# Patient Record
Sex: Male | Born: 1995 | Race: White | Hispanic: No | Marital: Single | Smoking: Current some day smoker
Health system: Southern US, Community
[De-identification: ages and names within clinical notes are randomized; demographics above are authoritative.]

## PROBLEM LIST (undated history)

## (undated) DIAGNOSIS — R569 Unspecified convulsions: Secondary | ICD-10-CM

## (undated) HISTORY — PX: APPENDECTOMY: SHX54

---
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
## 2016-09-29 ENCOUNTER — Emergency Department
Admission: EM | Admit: 2016-09-29 | Discharge: 2016-09-29 | Disposition: A | Discharge status: Home / Self Care | Payer: Medicaid Other | Attending: Emergency Medicine | Admitting: Emergency Medicine

## 2016-09-29 ENCOUNTER — Emergency Department: Payer: Medicaid Other

## 2016-09-29 DIAGNOSIS — Y990 Civilian activity done for income or pay: Secondary | ICD-10-CM | POA: Diagnosis not present

## 2016-09-29 DIAGNOSIS — M79601 Pain in right arm: Secondary | ICD-10-CM

## 2016-09-29 DIAGNOSIS — Y929 Unspecified place or not applicable: Secondary | ICD-10-CM | POA: Diagnosis not present

## 2016-09-29 DIAGNOSIS — M79621 Pain in right upper arm: Secondary | ICD-10-CM | POA: Insufficient documentation

## 2016-09-29 DIAGNOSIS — Y9389 Activity, other specified: Secondary | ICD-10-CM | POA: Insufficient documentation

## 2016-09-29 DIAGNOSIS — Z791 Long term (current) use of non-steroidal anti-inflammatories (NSAID): Secondary | ICD-10-CM | POA: Diagnosis not present

## 2016-09-29 DIAGNOSIS — W228XXA Striking against or struck by other objects, initial encounter: Secondary | ICD-10-CM | POA: Insufficient documentation

## 2016-09-29 DIAGNOSIS — S4991XA Unspecified injury of right shoulder and upper arm, initial encounter: Secondary | ICD-10-CM | POA: Diagnosis present

## 2016-09-29 DIAGNOSIS — F17210 Nicotine dependence, cigarettes, uncomplicated: Secondary | ICD-10-CM | POA: Insufficient documentation

## 2016-09-29 MED ORDER — MELOXICAM 7.5 MG PO TABS: 7.5000 mg | ORAL_TABLET | Freq: Every day | ORAL | 0 refills | Status: AC

## 2016-09-29 NOTE — ED Provider Notes (Signed)
[facility] Emergency Department Provider Note ____________________________________________  Time seen: Approximately 2:41 PM  I have reviewed the triage vital signs and the nursing notes.   HISTORY  Chief Complaint Arm Pain    HPI Joshua Cannon is a 20 y.o. male that presents with right forearm pain after hitting it on the axle of his car 3 days ago. Patient states the pain feels like a throbbing pain. Patient denies swelling. Patient has not taken anything for pain. Patient injured same arm 1.5 years after breaking a windshield with it and is concerned there may still be a piece left in his arm.   No past medical history on file.  There are no active problems to display for this patient.   Past Surgical History:  Procedure Laterality Date  . APPENDECTOMY      Prior to Admission medications   Medication Sig Start Date End Date Taking? Authorizing Provider  ibuprofen (ADVIL,MOTRIN) 800 MG tablet Take 1 tablet (800 mg total) by mouth every 8 (eight) hours as needed. 11/11/15   Charmayne Sheerharles M Beers, PA-C  meloxicam (MOBIC) 7.5 MG tablet Take 1 tablet (7.5 mg total) by mouth daily. 09/29/16 10/13/16  Joshua DerryAshley Alliana Mcauliff, PA-C  sulfamethoxazole-trimethoprim (BACTRIM DS,SEPTRA DS) 800-160 MG tablet Take 1 tablet by mouth 2 (two) times daily. 11/11/15   Joshua Dakinharles M Beers, PA-C    Allergies Patient has no known allergies.  No family history on file.  Social History Social History  Substance Use Topics  . Smoking status: Current Some Day Smoker    Types: Cigarettes  . Smokeless tobacco: Not on file  . Alcohol use No    Review of Systems Constitutional: No recent illness. No fever. Cardiovascular: Denies chest pain or palpitations. Respiratory: Denies shortness of breath. Abdominal:  Denies abdominal pain. No nausea or vomiting  Musculoskeletal: Pain in right forearm.  Skin: Negative for rash, wound, lesion. Neurological: Negative for focal weakness or  numbness.  ____________________________________________   PHYSICAL EXAM:  VITAL SIGNS: ED Triage Vitals  Enc Vitals Group     BP 09/29/16 1312 135/69     Pulse Rate 09/29/16 1312 85     Resp 09/29/16 1312 18     Temp 09/29/16 1312 97.6 F (36.4 C)     Temp Source 09/29/16 1312 Oral     SpO2 09/29/16 1312 99 %     Weight 09/29/16 1313 155 lb (70.3 kg)     Height 09/29/16 1313 5\' 3"  (1.6 m)     Head Circumference --      Peak Flow --      Pain Score 09/29/16 1311 10     Pain Loc --      Pain Edu? --      Excl. in GC? --     Constitutional: Alert and oriented. Well appearing and in no acute distress. Eyes: Conjunctivae are normal. EOMI. Head: Atraumatic. Neck: No stridor.  Respiratory: Normal respiratory effort.   Musculoskeletal: Full ROM of upper extremities. No tenderness to palpation.   Neurologic:  Normal speech and language. No gross focal neurologic deficits are appreciated. Speech is normal. No gait instability. Skin:1/2 in scar and mobile lump over mid forearm.  Skin is warm, dry and intact. No rashes or bruises. Atraumatic. Psychiatric: Mood and affect are normal. Speech and behavior are normal. ______________________________________   LABS (all labs ordered are listed, but only abnormal results are displayed)  Labs Reviewed - No data to display ____________________________________________  RADIOLOGY  Joshua Cannon, Joshua Cannon,  personally viewed and evaluated these images (plain radiographs) as part of my medical decision making, as well as reviewing the written report by the radiologist.  There are 2 radioopaque foreign bodies in the soft tissue of forearm per radiology.    INITIAL IMPRESSION / ASSESSMENT AND PLAN / ED COURSE  Clinical Course     Pertinent labs & imaging results that were available during my care of the patient were reviewed by me and considered in my medical decision making (see chart for details).  Patient was instructed to RICE right arm  and take Meloxicam for pain. Patient should follow up with PCP and was instructed that 2 foreign bodies were seen on xray.   FINAL CLINICAL IMPRESSION(S) / ED DIAGNOSES  Final diagnoses:  Right arm pain      Joshua Cannon [place], PA-C 09/29/16 1553    Arnaldo NatalPaul F Malinda, MD 10/01/16 0005

## 2016-09-29 NOTE — ED Triage Notes (Signed)
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

## 2016-09-29 NOTE — ED Notes (Signed)
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

## 2016-12-29 DIAGNOSIS — L905 Scar conditions and fibrosis of skin: Secondary | ICD-10-CM | POA: Diagnosis not present

## 2016-12-29 DIAGNOSIS — R208 Other disturbances of skin sensation: Secondary | ICD-10-CM | POA: Diagnosis not present

## 2016-12-29 DIAGNOSIS — D485 Neoplasm of uncertain behavior of skin: Secondary | ICD-10-CM | POA: Diagnosis not present

## 2016-12-29 DIAGNOSIS — R234 Changes in skin texture: Secondary | ICD-10-CM | POA: Diagnosis not present

## 2018-09-16 ENCOUNTER — Other Ambulatory Visit: Payer: Self-pay

## 2018-09-16 ENCOUNTER — Emergency Department: Payer: Medicaid Other

## 2018-09-16 ENCOUNTER — Emergency Department
Admission: EM | Admit: 2018-09-16 | Discharge: 2018-09-16 | Disposition: A | Discharge status: Home / Self Care | Payer: Medicaid Other | Attending: Emergency Medicine | Admitting: Emergency Medicine

## 2018-09-16 DIAGNOSIS — R569 Unspecified convulsions: Secondary | ICD-10-CM | POA: Diagnosis not present

## 2018-09-16 DIAGNOSIS — F439 Reaction to severe stress, unspecified: Secondary | ICD-10-CM | POA: Diagnosis not present

## 2018-09-16 DIAGNOSIS — F430 Acute stress reaction: Secondary | ICD-10-CM | POA: Diagnosis not present

## 2018-09-16 DIAGNOSIS — F17210 Nicotine dependence, cigarettes, uncomplicated: Secondary | ICD-10-CM | POA: Diagnosis not present

## 2018-09-16 DIAGNOSIS — S0990XA Unspecified injury of head, initial encounter: Secondary | ICD-10-CM | POA: Diagnosis not present

## 2018-09-16 LAB — URINE DRUG SCREEN, QUALITATIVE (ARMC ONLY)
Amphetamines, Ur Screen: NOT DETECTED
BARBITURATES, UR SCREEN: NOT DETECTED
Benzodiazepine, Ur Scrn: POSITIVE — AB
CANNABINOID 50 NG, UR ~~LOC~~: POSITIVE — AB
COCAINE METABOLITE, UR ~~LOC~~: NOT DETECTED
MDMA (ECSTASY) UR SCREEN: NOT DETECTED
Methadone Scn, Ur: NOT DETECTED
Opiate, Ur Screen: NOT DETECTED
PHENCYCLIDINE (PCP) UR S: NOT DETECTED
Tricyclic, Ur Screen: NOT DETECTED

## 2018-09-16 LAB — CBC WITH DIFFERENTIAL/PLATELET
Abs Immature Granulocytes: 0.2 10*3/uL — ABNORMAL HIGH (ref 0.00–0.07)
Basophils Absolute: 0.1 10*3/uL (ref 0.0–0.1)
Basophils Relative: 1 %
EOS ABS: 0.1 10*3/uL (ref 0.0–0.5)
EOS PCT: 1 %
HCT: 47.9 % (ref 39.0–52.0)
Hemoglobin: 16.4 g/dL (ref 13.0–17.0)
Immature Granulocytes: 2 %
Lymphocytes Relative: 18 %
Lymphs Abs: 2.3 10*3/uL (ref 0.7–4.0)
MCH: 30.9 pg (ref 26.0–34.0)
MCHC: 34.2 g/dL (ref 30.0–36.0)
MCV: 90.4 fL (ref 80.0–100.0)
MONO ABS: 0.7 10*3/uL (ref 0.1–1.0)
MONOS PCT: 6 %
NEUTROS ABS: 9.1 10*3/uL — AB (ref 1.7–7.7)
Neutrophils Relative %: 72 %
Platelets: 214 10*3/uL (ref 150–400)
RBC: 5.3 MIL/uL (ref 4.22–5.81)
RDW: 12.5 % (ref 11.5–15.5)
WBC: 12.3 10*3/uL — AB (ref 4.0–10.5)
nRBC: 0 % (ref 0.0–0.2)

## 2018-09-16 LAB — BASIC METABOLIC PANEL
Anion gap: 10 (ref 5–15)
BUN: 11 mg/dL (ref 6–20)
CO2: 26 mmol/L (ref 22–32)
CREATININE: 0.95 mg/dL (ref 0.61–1.24)
Calcium: 9.1 mg/dL (ref 8.9–10.3)
Chloride: 103 mmol/L (ref 98–111)
GFR calc Af Amer: >60 mL/min (ref >60)
GLUCOSE: 118 mg/dL — AB (ref 70–99)
Potassium: 3.8 mmol/L (ref 3.5–5.1)
Sodium: 139 mmol/L (ref 135–145)

## 2018-09-16 LAB — ETHANOL

## 2018-09-16 NOTE — ED Provider Notes (Signed)
[facility] Emergency Department Provider Note  ____________________________________________   I have reviewed the triage vital signs and the nursing notes.   HISTORY  Chief Complaint Seizures   History limited by: Not Limited   HPI Joshua Cannon is a 22 y.o. male who presents to the emergency department today because of concern for seizure like activity. The patient states that he has been under a lot of stress recently. The patient's mother states she gets stressed induced seizures. Today he was out with his family when he had full body convulsions per his mother. The patient did not feel bad today and had no sense that he was about to have any episode. The patient is complaining of some headache after the episode. The mother states it lasted roughly 5 minutes. The patient was not incontinent. Denies any recent sleep depravation or alcohol use. Does admit to [facility] use.    Per medical record review patient has a history of appendectomy  History reviewed. No pertinent past medical history.  There are no active problems to display for this patient.   Past Surgical History:  Procedure Laterality Date  . APPENDECTOMY      Prior to Admission medications   Medication Sig Start Date End Date Taking? Authorizing Provider  ibuprofen (ADVIL,MOTRIN) 800 MG tablet Take 1 tablet (800 mg total) by mouth every 8 (eight) hours as needed. 11/11/15   Beers, Charmayne Sheer, PA-C  sulfamethoxazole-trimethoprim (BACTRIM DS,SEPTRA DS) 800-160 MG tablet Take 1 tablet by mouth 2 (two) times daily. 11/11/15   [name], PA-C    Allergies Patient has no known allergies.  History reviewed. No pertinent family history.  Social History Social History   Tobacco Use  . Smoking status: Current Some Day Smoker    Types: Cigarettes  . Smokeless tobacco: Never Used  Substance Use Topics  . Alcohol use: No  . Drug use: No    Review of Systems Constitutional: No  fever/chills Eyes: No visual changes. ENT: No sore throat. Cardiovascular: Denies chest pain. Respiratory: Denies shortness of breath. Gastrointestinal: No abdominal pain.  No nausea, no vomiting.  No diarrhea.  Genitourinary: Negative for dysuria. Musculoskeletal: Negative for back pain. Skin: Negative for rash. Neurological: Positive for headache, seizure like activity.  ____________________________________________   PHYSICAL EXAM:  VITAL SIGNS: ED Triage Vitals  Enc Vitals Group     BP 09/16/18 1608 (!) 154/86     Pulse Rate 09/16/18 1608 (!) 111     Resp --      Temp 09/16/18 1608 98.2 F (36.8 C)     Temp Source 09/16/18 1608 Oral     SpO2 09/16/18 1558 96 %     Weight 09/16/18 1608 153 lb (69.4 kg)     Height 09/16/18 1608 5\' 6"  (1.676 m)     Head Circumference --      Peak Flow --      Pain Score 09/16/18 1608 0    Constitutional: Alert and oriented.  Eyes: Conjunctivae are normal.  ENT      Head: Normocephalic. Small bruise to forehead.       Nose: No congestion/rhinnorhea.      Mouth/Throat: Mucous membranes are moist.      Neck: No stridor. Hematological/Lymphatic/Immunilogical: No cervical lymphadenopathy. Cardiovascular: Normal rate, regular rhythm.  No murmurs, rubs, or gallops.  Respiratory: Normal respiratory effort without tachypnea nor retractions. Breath sounds are clear and equal bilaterally. No wheezes/rales/rhonchi. Gastrointestinal: Soft and non tender. No rebound. No guarding.  Genitourinary: Deferred Musculoskeletal: Normal range of motion in all extremities. No lower extremity edema. Neurologic:  Normal speech and language. No gross focal neurologic deficits are appreciated.  Skin:  Skin is warm, dry and intact. No rash noted. Psychiatric: Mood and affect are normal. Speech and behavior are normal. Patient exhibits appropriate insight and judgment.  ____________________________________________    LABS (pertinent  positives/negatives)  Ethanol <10 BMP wnl except glu 118 CBC wbc 12.3, hgb 16.4, plt 214  ____________________________________________   EKG  I, Phineas Semen, attending physician, personally viewed and interpreted this EKG  EKG Time: 1635 Rate: 82 Rhythm: sinus rhythm Axis: normal Intervals: qtc 412 QRS: RSR' in V1 ST changes: no st elevation Impression: RSR' in V1 otherwise normal  ____________________________________________    RADIOLOGY  CT head No acute findings  ____________________________________________   PROCEDURES  Procedures  ____________________________________________   INITIAL IMPRESSION / ASSESSMENT AND PLAN / ED COURSE  Pertinent labs & imaging results that were available during my care of the patient were reviewed by me and considered in my medical decision making (see chart for details).   Patient presented to the emergency department today with seizure-like activity.  On exam here patient is awake alert and completely appropriate.  Patient has no personal history of seizures although mother states she gets seizures that are stress-induced.  Head CT blood work here without any obvious etiology of the patient's symptoms.  He does have some cannabis on his UDS.  The patient was given neurology follow-up information.  We did discuss importance of the patient not drive or engage in any activity that could be dangerous to him himself or others if he was to have another seizure.   ____________________________________________   FINAL CLINICAL IMPRESSION(S) / ED DIAGNOSES  Final diagnoses:  Seizure-like activity (HCC)  Stress     Note: This dictation was prepared with Dragon dictation. Any transcriptional errors that result from this process are unintentional     Phineas Semen, MD 09/16/18 [phone]

## 2018-09-16 NOTE — Discharge Instructions (Addendum)
As we discussed please do not drive, go up on ladders/roofs, go swimming or engage in any activity that could be dangerous to yourself or others if you were to have another seizure. Please follow up with neurology. Please seek medical attention for any high fevers, chest pain, shortness of breath, change in behavior, persistent vomiting, bloody stool or any other new or concerning symptoms.

## 2018-09-16 NOTE — ED Notes (Signed)
Seizure prec in place.  

## 2018-09-16 NOTE — ED Triage Notes (Signed)
Pt brought in via EMS when he collapsed while trick-or-treating. EMS states fam history of seizures but this is pt's first episode. Per EMS/family seizure lasted about 5 mins. Per EMS it took 10 mins for pt to get back to baseline (A&Ox4).

## 2018-09-16 NOTE — ED Notes (Signed)
Pt has small hematoma to L side of forehead.

## 2018-09-20 DIAGNOSIS — Z113 Encounter for screening for infections with a predominantly sexual mode of transmission: Secondary | ICD-10-CM | POA: Diagnosis not present

## 2018-10-21 ENCOUNTER — Other Ambulatory Visit: Payer: Self-pay

## 2018-10-21 ENCOUNTER — Emergency Department
Admission: EM | Admit: 2018-10-21 | Discharge: 2018-10-21 | Disposition: A | Discharge status: Home / Self Care | Payer: Medicaid Other | Attending: Emergency Medicine | Admitting: Emergency Medicine

## 2018-10-21 ENCOUNTER — Encounter: Payer: Self-pay | Admitting: Emergency Medicine

## 2018-10-21 DIAGNOSIS — R000 Tachycardia, unspecified: Secondary | ICD-10-CM | POA: Diagnosis not present

## 2018-10-21 DIAGNOSIS — F17210 Nicotine dependence, cigarettes, uncomplicated: Secondary | ICD-10-CM | POA: Diagnosis not present

## 2018-10-21 DIAGNOSIS — R404 Transient alteration of awareness: Secondary | ICD-10-CM | POA: Diagnosis not present

## 2018-10-21 DIAGNOSIS — R569 Unspecified convulsions: Secondary | ICD-10-CM | POA: Diagnosis not present

## 2018-10-21 LAB — CBC WITH DIFFERENTIAL/PLATELET
Abs Immature Granulocytes: 0.12 10*3/uL — ABNORMAL HIGH (ref 0.00–0.07)
BASOS PCT: 1 %
Basophils Absolute: 0.1 10*3/uL (ref 0.0–0.1)
EOS ABS: 0.2 10*3/uL (ref 0.0–0.5)
Eosinophils Relative: 1 %
HEMATOCRIT: 53 % — AB (ref 39.0–52.0)
Hemoglobin: 17 g/dL (ref 13.0–17.0)
Immature Granulocytes: 1 %
LYMPHS ABS: 2.7 10*3/uL (ref 0.7–4.0)
Lymphocytes Relative: 17 %
MCH: 30.7 pg (ref 26.0–34.0)
MCHC: 32.1 g/dL (ref 30.0–36.0)
MCV: 95.8 fL (ref 80.0–100.0)
MONOS PCT: 6 %
Monocytes Absolute: 0.9 10*3/uL (ref 0.1–1.0)
Neutro Abs: 11.8 10*3/uL — ABNORMAL HIGH (ref 1.7–7.7)
Neutrophils Relative %: 74 %
PLATELETS: 296 10*3/uL (ref 150–400)
RBC: 5.53 MIL/uL (ref 4.22–5.81)
RDW: 13.3 % (ref 11.5–15.5)
WBC: 15.8 10*3/uL — ABNORMAL HIGH (ref 4.0–10.5)
nRBC: 0 % (ref 0.0–0.2)

## 2018-10-21 LAB — BASIC METABOLIC PANEL
ANION GAP: 25 — AB (ref 5–15)
BUN: 8 mg/dL (ref 6–20)
CALCIUM: 9.3 mg/dL (ref 8.9–10.3)
CO2: 13 mmol/L — AB (ref 22–32)
CREATININE: 1.02 mg/dL (ref 0.61–1.24)
Chloride: 103 mmol/L (ref 98–111)
GLUCOSE: 187 mg/dL — AB (ref 70–99)
Potassium: 4.2 mmol/L (ref 3.5–5.1)
Sodium: 141 mmol/L (ref 135–145)

## 2018-10-21 LAB — URINE DRUG SCREEN, QUALITATIVE (ARMC ONLY)
Amphetamines, Ur Screen: NOT DETECTED
BENZODIAZEPINE, UR SCRN: NOT DETECTED
Barbiturates, Ur Screen: NOT DETECTED
CANNABINOID 50 NG, UR ~~LOC~~: POSITIVE — AB
COCAINE METABOLITE, UR ~~LOC~~: NOT DETECTED
MDMA (Ecstasy)Ur Screen: NOT DETECTED
METHADONE SCREEN, URINE: NOT DETECTED
Opiate, Ur Screen: NOT DETECTED
Phencyclidine (PCP) Ur S: NOT DETECTED
TRICYCLIC, UR SCREEN: NOT DETECTED

## 2018-10-21 LAB — GLUCOSE, CAPILLARY: GLUCOSE-CAPILLARY: 174 mg/dL — AB (ref 70–99)

## 2018-10-21 LAB — ETHANOL: Alcohol, Ethyl (B): <10 mg/dL (ref <10)

## 2018-10-21 MED ORDER — LEVETIRACETAM 500 MG PO TABS: 500.0000 mg | ORAL_TABLET | Freq: Two times a day (BID) | ORAL | 1 refills | Status: AC

## 2018-10-21 MED ORDER — LEVETIRACETAM IN NACL 1000 MG/100ML IV SOLN
1000.0000 mg | Freq: Once | INTRAVENOUS | Status: AC
  Administered 2018-10-21: 1000 mg via INTRAVENOUS
  Filled 2018-10-21: qty 100

## 2018-10-21 NOTE — ED Provider Notes (Signed)
[facility] Emergency Department Provider Note  ____________________________________________   I have reviewed the triage vital signs and the nursing notes.   HISTORY  Chief Complaint Seizures   History limited by: Not Limited   HPI Joshua Cannon is a 22 y.o. male who presents to the emergency department today after an episode of seizure-like activity.  The patient was at work when it happened.  He just recalls waking up on the floor.  States he had had a normal morning.  A drink his normal stuff.  This point on a complaint of a little bit of low back pain where he thinks he hit the ground.  He was seen in the emergency department a little over 1 month ago for similar episode with negative work-up.  Patient states he has not yet been able to follow-up with neurology.  He denies any recent fevers or illness.    Per medical record review patient has a history of ER visit for seizure like activity with negative work up including CT head.   History reviewed. No pertinent past medical history.  There are no active problems to display for this patient.   Past Surgical History:  Procedure Laterality Date  . APPENDECTOMY      Prior to Admission medications   Medication Sig Start Date End Date Taking? Authorizing Provider  ibuprofen (ADVIL,MOTRIN) 800 MG tablet Take 1 tablet (800 mg total) by mouth every 8 (eight) hours as needed. 11/11/15   Beers, Charmayne Sheerharles M, PA-C  sulfamethoxazole-trimethoprim (BACTRIM DS,SEPTRA DS) 800-160 MG tablet Take 1 tablet by mouth 2 (two) times daily. 11/11/15   Evangeline DakinBeers, Charles M, PA-C    Allergies Patient has no known allergies.  No family history on file.  Social History Social History   Tobacco Use  . Smoking status: Current Some Day Smoker    Types: Cigarettes  . Smokeless tobacco: Never Used  Substance Use Topics  . Alcohol use: No  . Drug use: No    Review of Systems Constitutional: No fever/chills Eyes: No  visual changes. ENT: No sore throat. Cardiovascular: Denies chest pain. Respiratory: Denies shortness of breath. Gastrointestinal: No abdominal pain.  No nausea, no vomiting.  No diarrhea.   Genitourinary: Negative for dysuria. Musculoskeletal: Positive for low back pain. Skin: Negative for rash. Neurological: Positive for seizure like activity.  ____________________________________________   PHYSICAL EXAM:  VITAL SIGNS: ED Triage Vitals  Enc Vitals Group     BP 10/21/18 0933 128/74     Pulse Rate 10/21/18 0933 (!) 111     Resp 10/21/18 0933 16     Temp 10/21/18 0933 98 F (36.7 C)     Temp Source 10/21/18 0933 Oral     SpO2 10/21/18 0933 96 %     Weight 10/21/18 0931 170 lb (77.1 kg)     Height 10/21/18 0931 5\' 7"  (1.702 m)     Head Circumference --      Peak Flow --      Pain Score 10/21/18 0931 0   Constitutional: Alert and oriented.  Eyes: Conjunctivae are normal.  ENT      Head: Normocephalic and atraumatic.      Nose: No congestion/rhinnorhea.      Mouth/Throat: Mucous membranes are moist.      Neck: No stridor. Hematological/Lymphatic/Immunilogical: No cervical lymphadenopathy. Cardiovascular: Normal rate, regular rhythm.  No murmurs, rubs, or gallops.  Respiratory: Normal respiratory effort without tachypnea nor retractions. Breath sounds are clear and equal bilaterally. No wheezes/rales/rhonchi.  Gastrointestinal: Soft and non tender. No rebound. No guarding.  Genitourinary: Deferred Musculoskeletal: Normal range of motion in all extremities. No lower extremity edema. Neurologic:  Normal speech and language. No gross focal neurologic deficits are appreciated.  Skin:  Skin is warm, dry and intact. No rash noted. Psychiatric: Mood and affect are normal. Speech and behavior are normal. Patient exhibits appropriate insight and judgment.  ____________________________________________    LABS (pertinent positives/negatives)  UDS marijuana positive BMP wnl except  glu 187, co2 13 CBC wbc 15.8, hgb 17.0, plt 296 Etoh <10  ____________________________________________   EKG  None  ____________________________________________    RADIOLOGY  None  ____________________________________________   PROCEDURES  Procedures  ____________________________________________   INITIAL IMPRESSION / ASSESSMENT AND PLAN / ED COURSE  Pertinent labs & imaging results that were available during my care of the patient were reviewed by me and considered in my medical decision making (see chart for details).   Patient presented to the emergency department today because of concerns for seizure-like activity.  Patient was seen a little over 1 month ago for another seizure like episode.  Given that this patient is now a second episode I did discuss with neurology.  They did recommend starting Keppra.  Will IV load here start on 500 mg twice daily.  Again discussed with the patient importance of neurology follow-up.   ____________________________________________   FINAL CLINICAL IMPRESSION(S) / ED DIAGNOSES  Final diagnoses:  Seizure-like activity (HCC)     Note: This dictation was prepared with Dragon dictation. Any transcriptional errors that result from this process are unintentional     Phineas Semen, MD 10/21/18 1309

## 2018-10-21 NOTE — Discharge Instructions (Addendum)
Please do not drive, operate heavy machinery or put yourself or others in a dangerous position if you were to have another seizure. Please seek medical attention for any high fevers, chest pain, shortness of breath, change in behavior, persistent vomiting, bloody stool or any other new or concerning symptoms.

## 2018-10-21 NOTE — ED Triage Notes (Addendum)
Pt to ED via EMS from work where he had witnessed seizure, per EMS witness states lasted a few min and postictal x5-7410min. VSS, A&OX4, pt appears fatigue. Pt states he has had 1 seizure in the past a few months ago

## 2019-05-12 ENCOUNTER — Encounter: Payer: Self-pay | Admitting: Emergency Medicine

## 2019-05-12 ENCOUNTER — Emergency Department
Admission: EM | Admit: 2019-05-12 | Discharge: 2019-05-13 | Disposition: A | Discharge status: Home / Self Care | Payer: Medicaid Other | Attending: Emergency Medicine | Admitting: Emergency Medicine

## 2019-05-12 ENCOUNTER — Other Ambulatory Visit: Payer: Self-pay

## 2019-05-12 ENCOUNTER — Emergency Department: Payer: Medicaid Other

## 2019-05-12 DIAGNOSIS — R404 Transient alteration of awareness: Secondary | ICD-10-CM | POA: Diagnosis not present

## 2019-05-12 DIAGNOSIS — F17210 Nicotine dependence, cigarettes, uncomplicated: Secondary | ICD-10-CM | POA: Insufficient documentation

## 2019-05-12 DIAGNOSIS — Z79899 Other long term (current) drug therapy: Secondary | ICD-10-CM | POA: Diagnosis not present

## 2019-05-12 DIAGNOSIS — T50904A Poisoning by unspecified drugs, medicaments and biological substances, undetermined, initial encounter: Secondary | ICD-10-CM | POA: Diagnosis not present

## 2019-05-12 DIAGNOSIS — T5894XA Toxic effect of carbon monoxide from unspecified source, undetermined, initial encounter: Secondary | ICD-10-CM | POA: Diagnosis not present

## 2019-05-12 HISTORY — DX: Unspecified convulsions: R56.9

## 2019-05-12 LAB — BLOOD GAS, ARTERIAL
Acid-Base Excess: 4 mmol/L — ABNORMAL HIGH (ref 0.0–2.0)
Bicarbonate: 29.2 mmol/L — ABNORMAL HIGH (ref 20.0–28.0)
FIO2: 0.21
O2 Saturation: 98 %
Patient temperature: 37
pCO2 arterial: 45 mmHg (ref 32.0–48.0)
pH, Arterial: 7.42 (ref 7.350–7.450)
pO2, Arterial: 103 mmHg (ref 83.0–108.0)

## 2019-05-12 LAB — CBC
HCT: 44.6 % (ref 39.0–52.0)
Hemoglobin: 15.1 g/dL (ref 13.0–17.0)
MCH: 31.1 pg (ref 26.0–34.0)
MCHC: 33.9 g/dL (ref 30.0–36.0)
MCV: 91.8 fL (ref 80.0–100.0)
Platelets: 266 10*3/uL (ref 150–400)
RBC: 4.86 MIL/uL (ref 4.22–5.81)
RDW: 12.8 % (ref 11.5–15.5)
WBC: 10.3 10*3/uL (ref 4.0–10.5)
nRBC: 0 % (ref 0.0–0.2)

## 2019-05-12 LAB — COOXEMETRY PANEL
Carboxyhemoglobin: 4.9 % — ABNORMAL HIGH (ref 0.5–1.5)
Methemoglobin: 0.5 % (ref 0.0–1.5)
O2 Saturation: 97.6 %
Total oxygen content: 94.3 mL/dL

## 2019-05-12 LAB — BASIC METABOLIC PANEL
Anion gap: 8 (ref 5–15)
BUN: 9 mg/dL (ref 6–20)
CO2: 27 mmol/L (ref 22–32)
Calcium: 9.4 mg/dL (ref 8.9–10.3)
Chloride: 103 mmol/L (ref 98–111)
Creatinine, Ser: 0.72 mg/dL (ref 0.61–1.24)
GFR calc Af Amer: >60 mL/min (ref >60)
GFR calc non Af Amer: >60 mL/min (ref >60)
Glucose, Bld: 95 mg/dL (ref 70–99)
Potassium: 3.9 mmol/L (ref 3.5–5.1)
Sodium: 138 mmol/L (ref 135–145)

## 2019-05-12 LAB — ETHANOL: Alcohol, Ethyl (B): <10 mg/dL (ref <10)

## 2019-05-12 MED ORDER — LEVETIRACETAM IN NACL 1000 MG/100ML IV SOLN
1000.0000 mg | Freq: Once | INTRAVENOUS | Status: AC
  Administered 2019-05-12: 1000 mg via INTRAVENOUS
  Filled 2019-05-12: qty 100

## 2019-05-12 MED ORDER — SODIUM CHLORIDE 0.9 % IV BOLUS
500.0000 mL | Freq: Once | INTRAVENOUS | Status: AC
  Administered 2019-05-12: 19:00:00 500 mL via INTRAVENOUS

## 2019-05-12 MED ORDER — DROPERIDOL 2.5 MG/ML IJ SOLN
5.0000 mg | Freq: Once | INTRAMUSCULAR | Status: AC
  Administered 2019-05-12: 5 mg via INTRAMUSCULAR

## 2019-05-12 MED ORDER — NALOXONE HCL 2 MG/2ML IJ SOSY
PREFILLED_SYRINGE | INTRAMUSCULAR | Status: AC
  Administered 2019-05-12: 2 mg via INTRAVENOUS
  Filled 2019-05-12: qty 2

## 2019-05-12 MED ORDER — NALOXONE HCL 2 MG/2ML IJ SOSY
2.0000 mg | PREFILLED_SYRINGE | Freq: Once | INTRAMUSCULAR | Status: AC
  Administered 2019-05-12: 2 mg via INTRAVENOUS

## 2019-05-12 NOTE — ED Notes (Signed)
Pt assisted to use bedside toilet. Pt missing toilet completely. Unsteady.

## 2019-05-12 NOTE — ED Notes (Signed)
Will start assessments/etc once pt back to ED room 12.

## 2019-05-12 NOTE — ED Notes (Addendum)
Pt roomed. This RN at bedside with [place]. Officer present.

## 2019-05-12 NOTE — ED Triage Notes (Signed)
Pt presents to ED in custody of [place] [employer] for medical clearance to go to jail. Pt was stopped by PD driving a vehicle, clearly intoxicated. Pt is slurring words and stumbling. PD states pt's girlfriend was also in vehicle using xanax. Pt does not state what he has been taking.

## 2019-05-12 NOTE — ED Notes (Signed)
Seizure pads in place. Pt leaving for CT. Officer remains with pt. Will place IV once pt back from CT. Bloodwork sent during triage.

## 2019-05-12 NOTE — ED Notes (Addendum)
PD was given pill by family member that was suspected to be what pt took. This RN was told by MD mcshane to take pill down to pharmacy to have pill identified. Pharmacist Corene Cornea identified pill as 2mg  xanax, Per PD pill may be needed for chemical testing. This RN was instructed by Raquel charge RN to put pill in pt specific meds in pyxis. Verified with Raquel RN. Pts nurse Metta Clines made aware.

## 2019-05-12 NOTE — ED Notes (Signed)
Joshua Cannon (mother to pt's kids) called wanting update on pt. Explained pt is "stable" and did not allow for any other exchange of information since pt is unable to tell this RN who he is okay with being updated on his condition. Joshua Cannon requested this RN let pt know she called. Will let pt know.

## 2019-05-12 NOTE — ED Notes (Signed)
ABG results to EDP McShane in person.

## 2019-05-12 NOTE — ED Notes (Signed)
Forensic blood draw completed using sterile technique and povidone-iodine to clean site prior to puncture to right AC. Pt tolerated well. 1 attempt.

## 2019-05-12 NOTE — ED Notes (Signed)
CO HB RESULTS NOTIFIED TO DR.JAMES ,MCSHAWAN

## 2019-05-12 NOTE — ED Notes (Signed)
3 security officers at bedside as pt keeps trying to leave.

## 2019-05-12 NOTE — ED Notes (Addendum)
HOB adjusted for pt.  

## 2019-05-12 NOTE — ED Notes (Signed)
No reaction to narcan noted by officer and this RN.

## 2019-05-12 NOTE — ED Provider Notes (Signed)
-----------------------------------------   11:52 PM on 05/12/2019 -----------------------------------------   Assumed care from Dr. Burlene Arnt.  In short, Joshua Cannon is a 23 y.o. male with a chief complaint of AMS and apparent benzo overdose (likely accidental).  Refer to the original H&P for additional details.  The plan initially was to watch the patient and keep him safe until he is sober given his reassuring medical work-up.  He has multiple outstanding warrants from [employer] and he will be arrested once he is sober and ready for discharge.    However, he came awake but is still clearly not completely sober nor does he have the capacity to make any decisions.  He could barely stand on his own but he was trying to walk out of the ED and representing an immediate danger to himself.  He needed to urinate but could only make it to the commode with assistance from nursing staff and was barely able to stand upright.  However, in spite of this, he was combative with staff and was also representing a potential danger to them.  I made the decision to place him under involuntary commitment given his lack of decision-making capacity.  He required a calming agent and given his young age and constellation of symptoms I felt that droperidol 5 mg intramuscular would be an appropriate calming agent.  He will get an EKG once he has calm down and is able to hold still.  I have asked staff to put him on 2 L oxygen by nasal cannula and on a pulse oximeter and cardiac monitor.  Once he is awake and sober and once again has capacity, presumably in the morning, he may be appropriate for IVC resection and discharged to law enforcement custody.  ED ECG REPORT I, Hinda Kehr, the attending physician, personally viewed and interpreted this ECG.  Date: 05/13/2019 EKG Time: 00: 01 Rate: 69 Rhythm: normal sinus rhythm QRS Axis: normal Intervals: normal, no QTc prolongation ST/T Wave abnormalities: There is some  slight ST segment elevation which is attributable to early repolarization Narrative Interpretation: no evidence of acute ischemia   ----------------------------------------- 12:11 AM on 05/13/2019 -----------------------------------------  Patient resting comfortably.  QTc normal .  No physical restraints were required.    (Note that documentation was delayed due to multiple ED patients requiring immediate care.)   In the early morning hours the patient became awake, alert, ambulatory without difficulty, and still verbally disagreeable, argumentative with staff and calling 1 of the nurses a "fucking bitch" when she tried to assist him.  He is medically stable and appropriate for discharge with law enforcement.  They have been alerted and will take him into custody due to his outstanding warrants.  I gave my usual customary apparent medication overdose discharge instructions.   Hinda Kehr, MD 05/13/19 [phone]

## 2019-05-12 NOTE — ED Notes (Signed)
Respiratory notified ABG ordered. RT states they will be by soon to collect.
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
## 2019-05-12 NOTE — ED Notes (Signed)
Pt alert but lethargic at times; pt's speech slurred sometimes incomprehensible.

## 2019-05-12 NOTE — ED Notes (Signed)
Pt's mother Driscilla Moats [phone] given brief update.

## 2019-05-13 NOTE — ED Notes (Signed)
Pt refuses to sign discharge paperwork

## 2019-05-13 NOTE — Discharge Instructions (Signed)
You were seen in the emergency department for decreased mental status which appears to be due to taking too many benzodiazepines.  Please avoid taking this type of medication in excess and follow-up with [facility] to discuss possible substance abuse issues.  Return to the emergency department if he develop any new or worsening symptoms as well as if you have any thoughts of harming yourself or anyone else.

## 2019-05-13 NOTE — ED Notes (Signed)
Pt placed on 4L O2 via [place] at this time.

## 2019-05-13 NOTE — ED Notes (Signed)
Pt awake and cussing at this RN. Pt helped to restroom with steady slow gait.

## 2019-05-14 NOTE — Progress Notes (Signed)
Pharmacy tech discovered a bag in pyxis return bin labeled with this patients name and MRN number. In the bag are pieces of Xanax tablets(possibly 2 mg tablets). This bag was logged in the patient own medication log and then the bag was placed into control substance destruction box.

## 2020-09-17 DIAGNOSIS — Z419 Encounter for procedure for purposes other than remedying health state, unspecified: Secondary | ICD-10-CM | POA: Diagnosis not present

## 2020-10-17 DIAGNOSIS — Z419 Encounter for procedure for purposes other than remedying health state, unspecified: Secondary | ICD-10-CM | POA: Diagnosis not present

## 2020-11-17 DIAGNOSIS — Z419 Encounter for procedure for purposes other than remedying health state, unspecified: Secondary | ICD-10-CM | POA: Diagnosis not present

## 2020-11-29 DIAGNOSIS — Z20822 Contact with and (suspected) exposure to covid-19: Secondary | ICD-10-CM | POA: Diagnosis not present

## 2020-12-18 DIAGNOSIS — Z419 Encounter for procedure for purposes other than remedying health state, unspecified: Secondary | ICD-10-CM | POA: Diagnosis not present

## 2021-01-15 DIAGNOSIS — Z419 Encounter for procedure for purposes other than remedying health state, unspecified: Secondary | ICD-10-CM | POA: Diagnosis not present

## 2021-02-15 DIAGNOSIS — Z419 Encounter for procedure for purposes other than remedying health state, unspecified: Secondary | ICD-10-CM | POA: Diagnosis not present

## 2021-03-17 DIAGNOSIS — Z419 Encounter for procedure for purposes other than remedying health state, unspecified: Secondary | ICD-10-CM | POA: Diagnosis not present

## 2021-04-17 DIAGNOSIS — Z419 Encounter for procedure for purposes other than remedying health state, unspecified: Secondary | ICD-10-CM | POA: Diagnosis not present

## 2021-05-17 DIAGNOSIS — Z419 Encounter for procedure for purposes other than remedying health state, unspecified: Secondary | ICD-10-CM | POA: Diagnosis not present

## 2021-06-17 DIAGNOSIS — Z419 Encounter for procedure for purposes other than remedying health state, unspecified: Secondary | ICD-10-CM | POA: Diagnosis not present

## 2021-07-18 DIAGNOSIS — Z419 Encounter for procedure for purposes other than remedying health state, unspecified: Secondary | ICD-10-CM | POA: Diagnosis not present

## 2021-08-17 DIAGNOSIS — Z419 Encounter for procedure for purposes other than remedying health state, unspecified: Secondary | ICD-10-CM | POA: Diagnosis not present

## 2021-09-03 DIAGNOSIS — F319 Bipolar disorder, unspecified: Secondary | ICD-10-CM | POA: Diagnosis not present

## 2021-09-09 DIAGNOSIS — F319 Bipolar disorder, unspecified: Secondary | ICD-10-CM | POA: Diagnosis not present

## 2021-09-16 DIAGNOSIS — F319 Bipolar disorder, unspecified: Secondary | ICD-10-CM | POA: Diagnosis not present

## 2021-09-17 DIAGNOSIS — Z419 Encounter for procedure for purposes other than remedying health state, unspecified: Secondary | ICD-10-CM | POA: Diagnosis not present

## 2021-09-23 DIAGNOSIS — F319 Bipolar disorder, unspecified: Secondary | ICD-10-CM | POA: Diagnosis not present

## 2021-10-15 DIAGNOSIS — F319 Bipolar disorder, unspecified: Secondary | ICD-10-CM | POA: Diagnosis not present

## 2021-10-17 DIAGNOSIS — Z419 Encounter for procedure for purposes other than remedying health state, unspecified: Secondary | ICD-10-CM | POA: Diagnosis not present

## 2021-10-23 DIAGNOSIS — F319 Bipolar disorder, unspecified: Secondary | ICD-10-CM | POA: Diagnosis not present

## 2021-10-29 DIAGNOSIS — F319 Bipolar disorder, unspecified: Secondary | ICD-10-CM | POA: Diagnosis not present

## 2021-11-04 DIAGNOSIS — F319 Bipolar disorder, unspecified: Secondary | ICD-10-CM | POA: Diagnosis not present

## 2021-11-08 ENCOUNTER — Emergency Department: Payer: Medicaid Other

## 2021-11-08 ENCOUNTER — Encounter: Payer: Self-pay | Admitting: Emergency Medicine

## 2021-11-08 ENCOUNTER — Other Ambulatory Visit: Payer: Self-pay

## 2021-11-08 DIAGNOSIS — S61411A Laceration without foreign body of right hand, initial encounter: Secondary | ICD-10-CM | POA: Insufficient documentation

## 2021-11-08 DIAGNOSIS — Z23 Encounter for immunization: Secondary | ICD-10-CM | POA: Insufficient documentation

## 2021-11-08 DIAGNOSIS — F17210 Nicotine dependence, cigarettes, uncomplicated: Secondary | ICD-10-CM | POA: Diagnosis not present

## 2021-11-08 DIAGNOSIS — W228XXA Striking against or struck by other objects, initial encounter: Secondary | ICD-10-CM | POA: Diagnosis not present

## 2021-11-08 DIAGNOSIS — M7989 Other specified soft tissue disorders: Secondary | ICD-10-CM | POA: Diagnosis not present

## 2021-11-08 DIAGNOSIS — S6991XA Unspecified injury of right wrist, hand and finger(s), initial encounter: Secondary | ICD-10-CM | POA: Diagnosis present
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
## 2021-11-08 NOTE — ED Notes (Signed)
Pt back to lobby from outside building, no acute distress noted.
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
## 2021-11-09 ENCOUNTER — Emergency Department
Admission: EM | Admit: 2021-11-09 | Discharge: 2021-11-09 | Disposition: A | Discharge status: Home / Self Care | Payer: Medicaid Other | Attending: Emergency Medicine | Admitting: Emergency Medicine

## 2021-11-09 DIAGNOSIS — S61219A Laceration without foreign body of unspecified finger without damage to nail, initial encounter: Secondary | ICD-10-CM

## 2021-11-09 MED ORDER — LIDOCAINE HCL (PF) 1 % IJ SOLN
INTRAMUSCULAR | Status: AC
  Filled 2021-11-09: qty 5

## 2021-11-09 MED ORDER — IBUPROFEN 800 MG PO TABS: 800.0000 mg | ORAL_TABLET | Freq: Three times a day (TID) | ORAL | 0 refills | Status: AC | PRN

## 2021-11-09 MED ORDER — CEPHALEXIN 500 MG PO CAPS
500.0000 mg | ORAL_CAPSULE | Freq: Once | ORAL | Status: AC
  Administered 2021-11-09: 03:00:00 500 mg via ORAL
  Filled 2021-11-09: qty 1

## 2021-11-09 MED ORDER — ONDANSETRON 4 MG PO TBDP: 4.0000 mg | ORAL_TABLET | Freq: Four times a day (QID) | ORAL | 0 refills | Status: AC | PRN

## 2021-11-09 MED ORDER — CEPHALEXIN 500 MG PO CAPS: 500.0000 mg | ORAL_CAPSULE | Freq: Four times a day (QID) | ORAL | 0 refills | Status: AC

## 2021-11-09 MED ORDER — TETANUS-DIPHTH-ACELL PERTUSSIS 5-2.5-18.5 LF-MCG/0.5 IM SUSY
0.5000 mL | PREFILLED_SYRINGE | Freq: Once | INTRAMUSCULAR | Status: AC
  Administered 2021-11-09: 03:00:00 0.5 mL via INTRAMUSCULAR

## 2021-11-09 MED ORDER — ONDANSETRON 4 MG PO TBDP
4.0000 mg | ORAL_TABLET | Freq: Once | ORAL | Status: AC
  Administered 2021-11-09: 03:00:00 4 mg via ORAL
  Filled 2021-11-09: qty 1

## 2021-11-09 MED ORDER — BACITRACIN ZINC 500 UNIT/GM EX OINT: TOPICAL_OINTMENT | Freq: Once | CUTANEOUS | Status: AC

## 2021-11-09 MED ORDER — OXYCODONE-ACETAMINOPHEN 5-325 MG PO TABS: 1.0000 | ORAL_TABLET | Freq: Four times a day (QID) | ORAL | 0 refills | Status: AC | PRN

## 2021-11-09 MED ORDER — OXYCODONE-ACETAMINOPHEN 5-325 MG PO TABS
1.0000 | ORAL_TABLET | Freq: Once | ORAL | Status: AC
  Administered 2021-11-09: 03:00:00 1 via ORAL
  Filled 2021-11-09: qty 1

## 2021-11-09 NOTE — Discharge Instructions (Signed)

## 2021-11-09 NOTE — ED Notes (Signed)
Bacitracin and non stick dressing applied. Dressing wrapped with sterile kling gauze. Wound care instructions provided to pt and spouse who verbalize understanding.
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
## 2021-11-12 ENCOUNTER — Telehealth: Payer: Self-pay

## 2021-11-12 DIAGNOSIS — M79641 Pain in right hand: Secondary | ICD-10-CM | POA: Diagnosis not present

## 2021-11-12 NOTE — Telephone Encounter (Signed)
Transition Care Management Follow-up Telephone Call Date of discharge and from where: 11/09/2021 from [facility] How have you been since you were released from the hospital? Pt stated that he is feeling well and did not have any questions or concerns at this time. Pt stated that he is interested in est with a PCP. I gave him info for [facility], [facility] and [facility]  Any questions or concerns? No  Items Reviewed: Did the pt receive and understand the discharge instructions provided? Yes  Medications obtained and verified? Yes  Other? No  Any new allergies since your discharge? No  Dietary orders reviewed? No Do you have support at home? Yes   Functional Questionnaire: (I = Independent and D = Dependent) ADLs: I  Bathing/Dressing- I  Meal Prep- I  Eating- I  Maintaining continence- I  Transferring/Ambulation- I  Managing Meds- I  Follow up appointments reviewed:  PCP Hospital f/u appt confirmed? No   Specialist Hospital f/u appt confirmed? Yes  Scheduled to see Ortho on 11/12/2021 @ 9:30am. Are transportation arrangements needed? No  If their condition worsens, is the pt aware to call PCP or go to the Emergency Dept.? Yes Was the patient provided with contact information for the PCP's office or ED? Yes Was to pt encouraged to call back with questions or concerns? Yes

## 2021-11-13 DIAGNOSIS — M79644 Pain in right finger(s): Secondary | ICD-10-CM | POA: Diagnosis not present

## 2021-11-13 DIAGNOSIS — G8918 Other acute postprocedural pain: Secondary | ICD-10-CM | POA: Diagnosis not present

## 2021-11-13 DIAGNOSIS — S66324A Laceration of extensor muscle, fascia and tendon of right ring finger at wrist and hand level, initial encounter: Secondary | ICD-10-CM | POA: Diagnosis not present

## 2021-11-13 DIAGNOSIS — S66322A Laceration of extensor muscle, fascia and tendon of right middle finger at wrist and hand level, initial encounter: Secondary | ICD-10-CM | POA: Diagnosis not present

## 2021-11-13 DIAGNOSIS — S61411A Laceration without foreign body of right hand, initial encounter: Secondary | ICD-10-CM | POA: Diagnosis not present

## 2021-11-13 DIAGNOSIS — F319 Bipolar disorder, unspecified: Secondary | ICD-10-CM | POA: Diagnosis not present

## 2021-11-17 DIAGNOSIS — Z419 Encounter for procedure for purposes other than remedying health state, unspecified: Secondary | ICD-10-CM | POA: Diagnosis not present

## 2021-11-19 DIAGNOSIS — S66801D Unspecified injury of other specified muscles, fascia and tendons at wrist and hand level, right hand, subsequent encounter: Secondary | ICD-10-CM | POA: Diagnosis not present

## 2021-11-21 DIAGNOSIS — F319 Bipolar disorder, unspecified: Secondary | ICD-10-CM | POA: Diagnosis not present

## 2021-11-28 DIAGNOSIS — F319 Bipolar disorder, unspecified: Secondary | ICD-10-CM | POA: Diagnosis not present

## 2021-11-28 DIAGNOSIS — S66801D Unspecified injury of other specified muscles, fascia and tendons at wrist and hand level, right hand, subsequent encounter: Secondary | ICD-10-CM | POA: Diagnosis not present

## 2021-12-03 DIAGNOSIS — S66801D Unspecified injury of other specified muscles, fascia and tendons at wrist and hand level, right hand, subsequent encounter: Secondary | ICD-10-CM | POA: Diagnosis not present

## 2021-12-03 DIAGNOSIS — F319 Bipolar disorder, unspecified: Secondary | ICD-10-CM | POA: Diagnosis not present

## 2021-12-12 DIAGNOSIS — F319 Bipolar disorder, unspecified: Secondary | ICD-10-CM | POA: Diagnosis not present

## 2021-12-18 DIAGNOSIS — Z419 Encounter for procedure for purposes other than remedying health state, unspecified: Secondary | ICD-10-CM | POA: Diagnosis not present

## 2021-12-27 DIAGNOSIS — F319 Bipolar disorder, unspecified: Secondary | ICD-10-CM | POA: Diagnosis not present

## 2022-01-07 DIAGNOSIS — F319 Bipolar disorder, unspecified: Secondary | ICD-10-CM | POA: Diagnosis not present

## 2022-01-15 DIAGNOSIS — Z419 Encounter for procedure for purposes other than remedying health state, unspecified: Secondary | ICD-10-CM | POA: Diagnosis not present

## 2022-01-22 DIAGNOSIS — F319 Bipolar disorder, unspecified: Secondary | ICD-10-CM | POA: Diagnosis not present

## 2022-02-15 DIAGNOSIS — Z419 Encounter for procedure for purposes other than remedying health state, unspecified: Secondary | ICD-10-CM | POA: Diagnosis not present

## 2022-02-20 DIAGNOSIS — F319 Bipolar disorder, unspecified: Secondary | ICD-10-CM | POA: Diagnosis not present

## 2022-03-17 DIAGNOSIS — Z419 Encounter for procedure for purposes other than remedying health state, unspecified: Secondary | ICD-10-CM | POA: Diagnosis not present

## 2022-04-14 IMAGING — CR DG HAND COMPLETE 3+V*R*
3 series · 3 of 3 positions shown · non-contrast
Comparison: None.

CLINICAL DATA: Avulsion over [DATE] knuckles. Tendons exposed. Concern
for foreign body.

EXAM:
RIGHT HAND - COMPLETE 3+ VIEW

[hand ap]
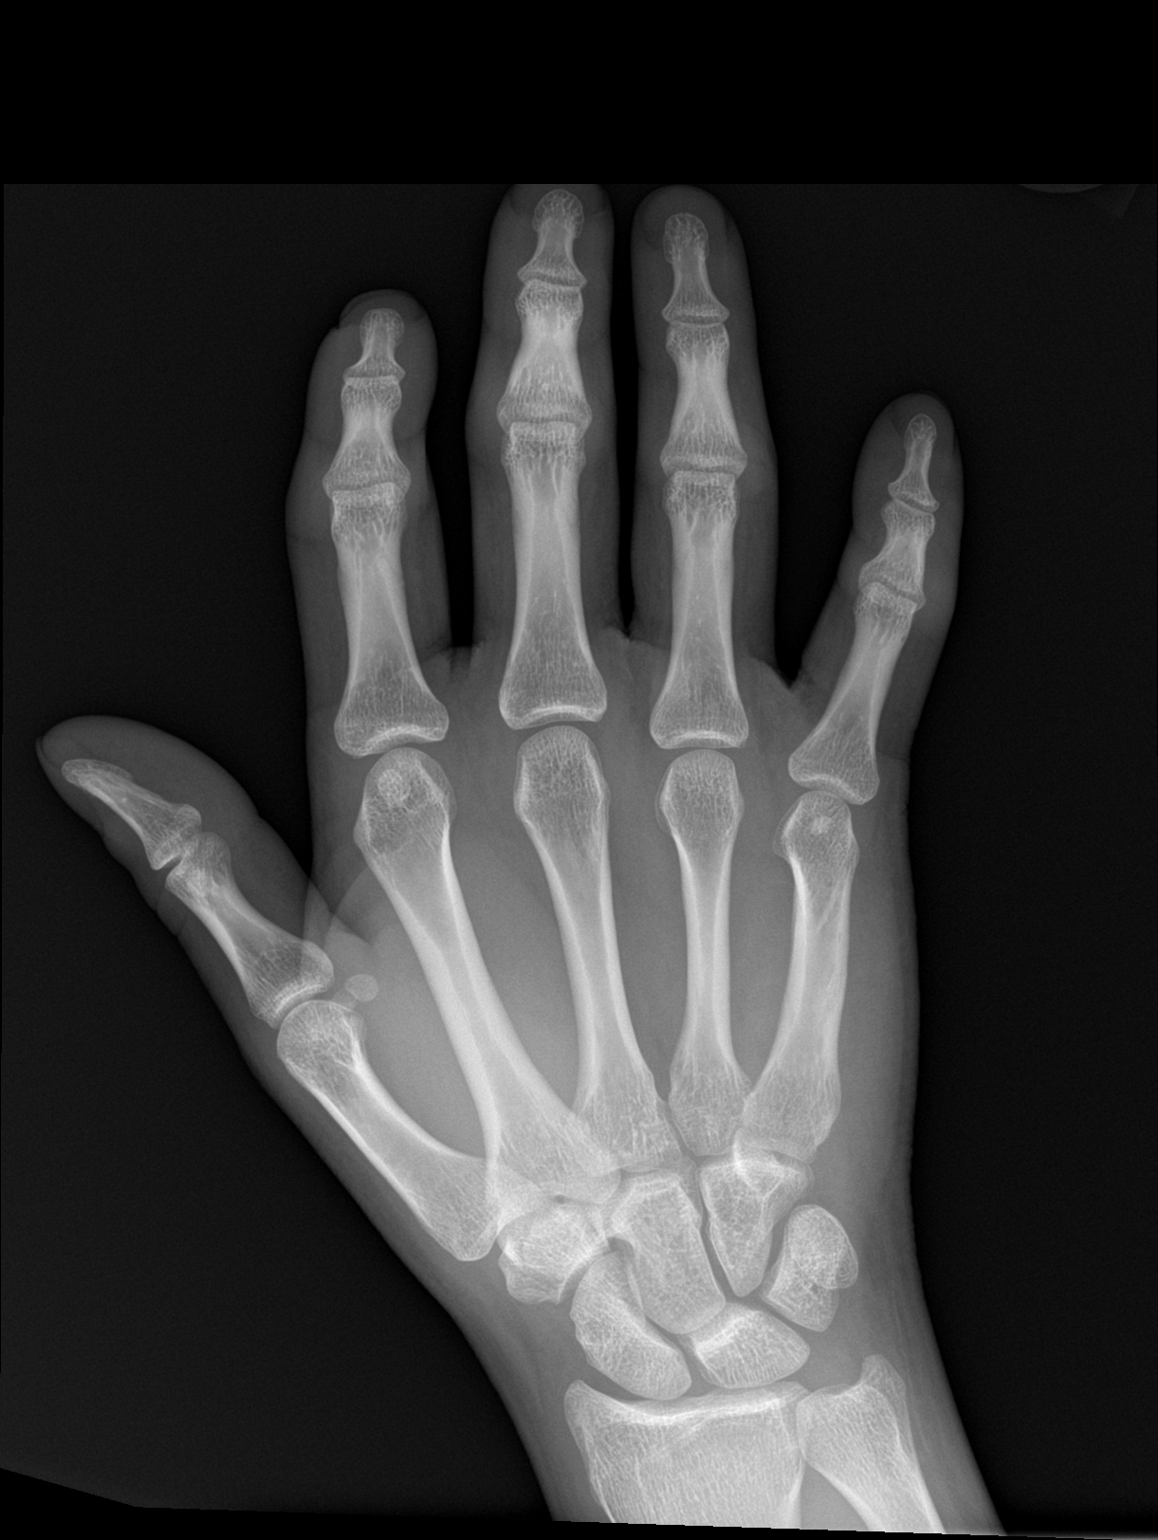

[hand obl]
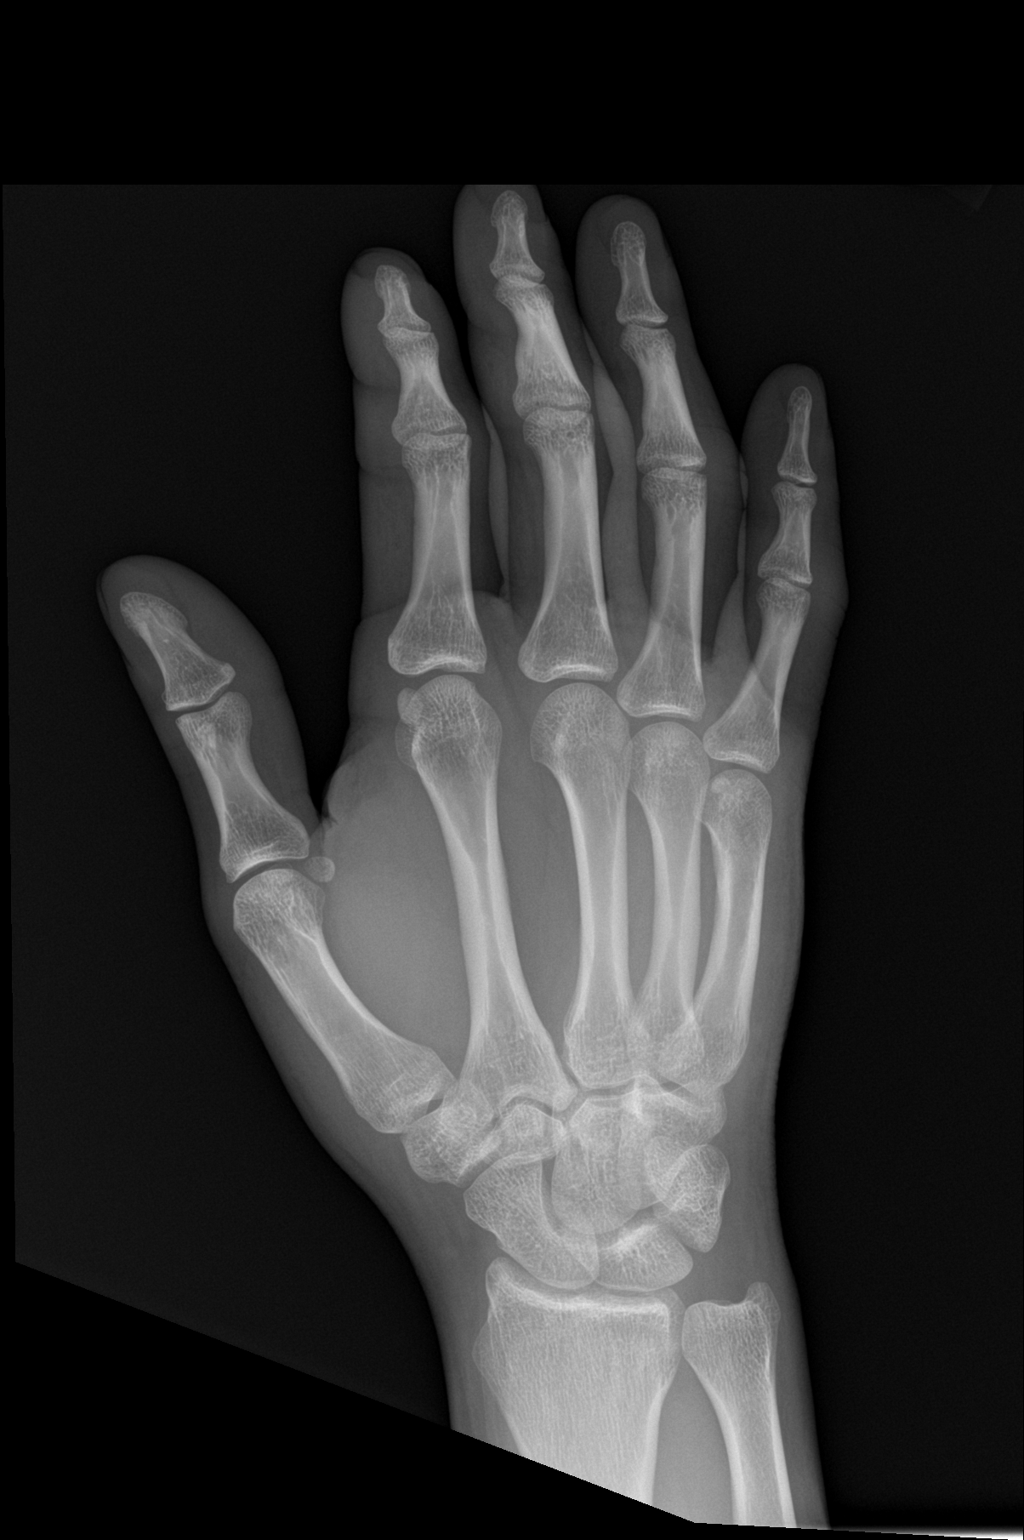

[hand lat]
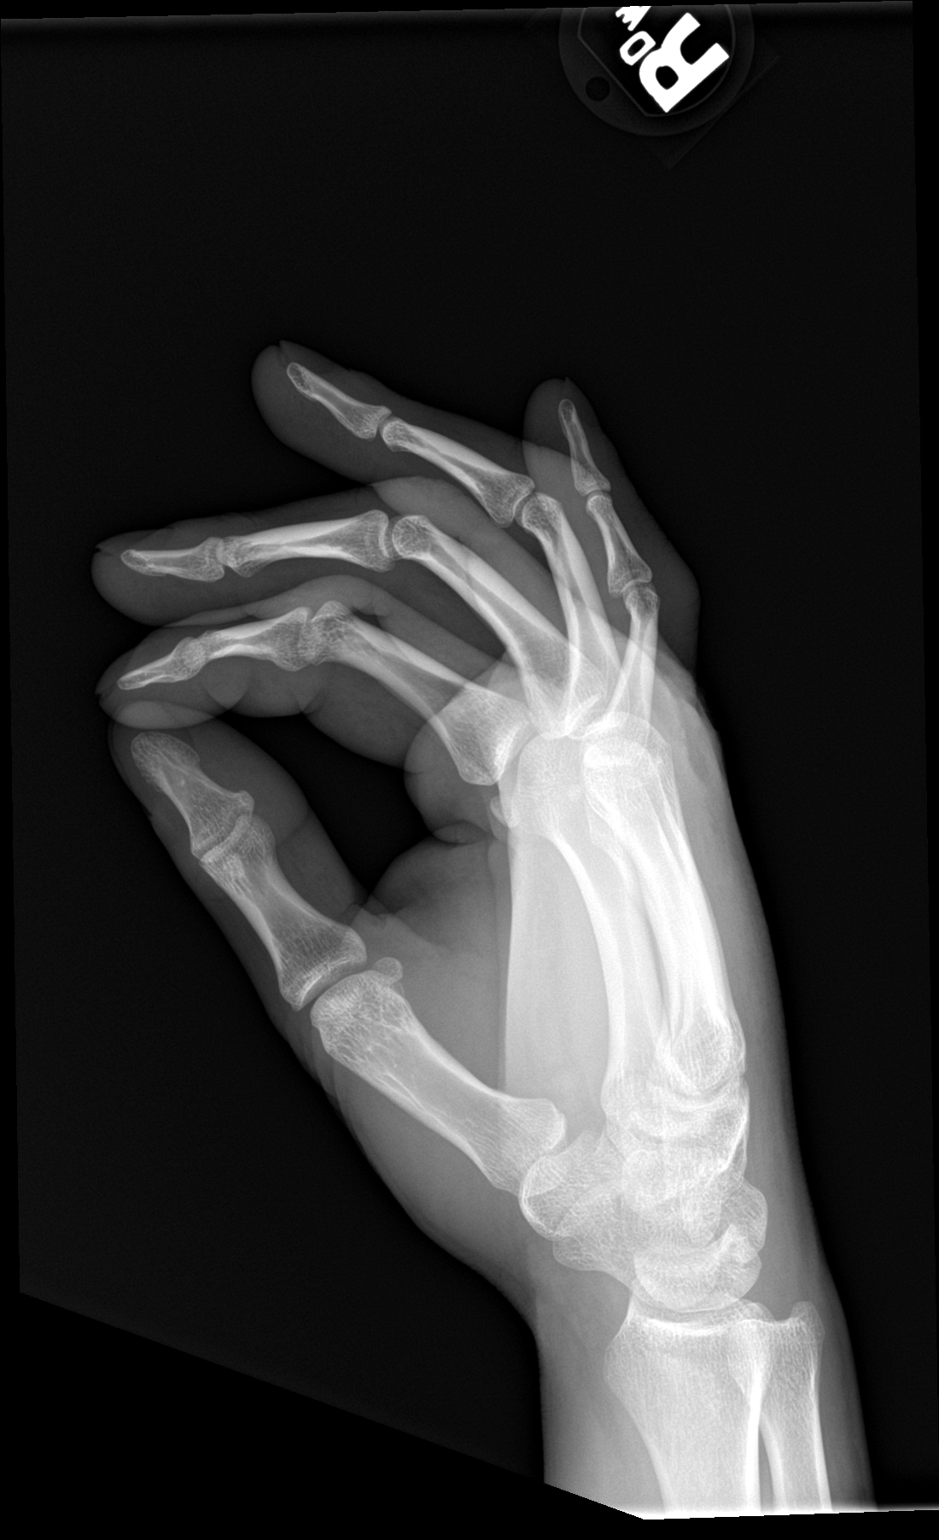

[3 of 3 positions shown; findings below may reference images not displayed]

FINDINGS: There is no evidence of fracture or dislocation. There is no
evidence of arthropathy or other focal bone abnormality. Soft tissue
swelling about the dorsum of the hand. No radiopaque foreign body.
IMPRESSION: 1. No evidence of fracture or dislocation.

2. Soft tissue swelling about the dorsum of the hand without
evidence of radiopaque foreign body.

## 2022-04-17 DIAGNOSIS — Z419 Encounter for procedure for purposes other than remedying health state, unspecified: Secondary | ICD-10-CM | POA: Diagnosis not present

## 2022-05-17 DIAGNOSIS — Z419 Encounter for procedure for purposes other than remedying health state, unspecified: Secondary | ICD-10-CM | POA: Diagnosis not present

## 2022-06-17 DIAGNOSIS — Z419 Encounter for procedure for purposes other than remedying health state, unspecified: Secondary | ICD-10-CM | POA: Diagnosis not present

## 2022-07-18 DIAGNOSIS — Z419 Encounter for procedure for purposes other than remedying health state, unspecified: Secondary | ICD-10-CM | POA: Diagnosis not present

## 2022-08-07 ENCOUNTER — Telehealth: Payer: Self-pay

## 2022-08-07 NOTE — Telephone Encounter (Signed)
Patient called to connect with a Primary Care Provider. Unable to leave VM, VM not set up/VM full. Patient has Managed Medicaid. If patient returns call, please reach out to Judson Roch or [employer].

## 2022-08-15 ENCOUNTER — Telehealth: Payer: Self-pay

## 2022-08-15 NOTE — Telephone Encounter (Signed)
Patient called to connect with a Primary Care Provider. Unable to leave VM, VM not set up/VM full. Patient has Managed Medicaid. If patient returns call, please reach out to [employer], Therapist, sports.   Phone has been disconnected.

## 2022-08-17 DIAGNOSIS — Z419 Encounter for procedure for purposes other than remedying health state, unspecified: Secondary | ICD-10-CM | POA: Diagnosis not present

## 2022-08-20 ENCOUNTER — Telehealth: Payer: Self-pay

## 2022-08-20 NOTE — Telephone Encounter (Signed)
Patient called to connect with a Primary Care Provider. Unable to leave VM, VM not set up/VM full. Patient has Managed Medicaid. If patient returns call, please reach out to Jadamarie Butson or Leslie, RN.   

## 2022-09-17 DIAGNOSIS — Z419 Encounter for procedure for purposes other than remedying health state, unspecified: Secondary | ICD-10-CM | POA: Diagnosis not present

## 2022-10-17 DIAGNOSIS — Z419 Encounter for procedure for purposes other than remedying health state, unspecified: Secondary | ICD-10-CM | POA: Diagnosis not present

## 2022-11-17 DIAGNOSIS — Z419 Encounter for procedure for purposes other than remedying health state, unspecified: Secondary | ICD-10-CM | POA: Diagnosis not present

## 2022-12-18 DIAGNOSIS — Z419 Encounter for procedure for purposes other than remedying health state, unspecified: Secondary | ICD-10-CM | POA: Diagnosis not present

## 2023-01-16 DIAGNOSIS — Z419 Encounter for procedure for purposes other than remedying health state, unspecified: Secondary | ICD-10-CM | POA: Diagnosis not present

## 2023-02-16 DIAGNOSIS — Z419 Encounter for procedure for purposes other than remedying health state, unspecified: Secondary | ICD-10-CM | POA: Diagnosis not present

## 2023-02-25 ENCOUNTER — Ambulatory Visit: Payer: Self-pay

## 2023-02-25 DIAGNOSIS — R1110 Vomiting, unspecified: Secondary | ICD-10-CM | POA: Diagnosis not present

## 2023-02-25 DIAGNOSIS — R079 Chest pain, unspecified: Secondary | ICD-10-CM | POA: Diagnosis not present

## 2023-02-25 NOTE — Telephone Encounter (Signed)
    Chief Complaint: Chest pain that comes and goes. Symptoms: Above Frequency: Yesterday Pertinent Negatives: Patient denies SOB,dizziness Disposition: [x] ED /[] Urgent Care (no appt availability in office) / [] Appointment(In office/virtual)/ []  [facility]/ [] Home Care/ [] Refused Recommended Disposition /[] [facility]/ []  Follow-up with PCP Additional Notes:   Reason for Disposition  [1] Chest pain (or "angina") comes and goes AND [2] is happening more often (increasing in frequency) or getting worse (increasing in severity)  (Exception: Chest pains that last only a few seconds.)  Answer Assessment - Initial Assessment Questions 1. LOCATION: "Where does it hurt?"       Middle 2. RADIATION: "Does the pain go anywhere else?" (e.g., into neck, jaw, arms, back)     No 3. ONSET: "When did the chest pain begin?" (Minutes, hours or days)      Yesterday 4. PATTERN: "Does the pain come and go, or has it been constant since it started?"  "Does it get worse with exertion?"      Comes and goes 5. DURATION: "How long does it last" (e.g., seconds, minutes, hours)     10 Minures 6. SEVERITY: "How bad is the pain?"  (e.g., Scale 1-10; mild, moderate, or severe)    - MILD (1-3): doesn't interfere with normal activities     - MODERATE (4-7): interferes with normal activities or awakens from sleep    - SEVERE (8-10): excruciating pain, unable to do any normal activities       8 7. CARDIAC RISK FACTORS: "Do you have any history of heart problems or risk factors for heart disease?" (e.g., angina, prior heart attack; diabetes, high blood pressure, high cholesterol, smoker, or strong family history of heart disease)     No 8. PULMONARY RISK FACTORS: "Do you have any history of lung disease?"  (e.g., blood clots in lung, asthma, emphysema, birth control pills)     No 9. CAUSE: "What do you think is causing the chest pain?"     Unsure 10. OTHER SYMPTOMS: "Do you have any other  symptoms?" (e.g., dizziness, nausea, vomiting, sweating, fever, difficulty breathing, cough)       No 11. PREGNANCY: "Is there any chance you are pregnant?" "When was your last menstrual period?"       N/a  Protocols used: Chest Pain-A-AH

## 2023-03-18 DIAGNOSIS — Z419 Encounter for procedure for purposes other than remedying health state, unspecified: Secondary | ICD-10-CM | POA: Diagnosis not present

## 2023-03-19 DIAGNOSIS — F419 Anxiety disorder, unspecified: Secondary | ICD-10-CM | POA: Diagnosis not present

## 2023-03-26 DIAGNOSIS — F419 Anxiety disorder, unspecified: Secondary | ICD-10-CM | POA: Diagnosis not present

## 2023-04-02 DIAGNOSIS — F419 Anxiety disorder, unspecified: Secondary | ICD-10-CM | POA: Diagnosis not present

## 2023-04-15 DIAGNOSIS — F419 Anxiety disorder, unspecified: Secondary | ICD-10-CM | POA: Diagnosis not present

## 2023-04-18 DIAGNOSIS — Z419 Encounter for procedure for purposes other than remedying health state, unspecified: Secondary | ICD-10-CM | POA: Diagnosis not present

## 2023-04-20 DIAGNOSIS — F419 Anxiety disorder, unspecified: Secondary | ICD-10-CM | POA: Diagnosis not present

## 2023-04-28 DIAGNOSIS — F419 Anxiety disorder, unspecified: Secondary | ICD-10-CM | POA: Diagnosis not present

## 2023-05-06 DIAGNOSIS — F419 Anxiety disorder, unspecified: Secondary | ICD-10-CM | POA: Diagnosis not present

## 2023-05-12 DIAGNOSIS — F419 Anxiety disorder, unspecified: Secondary | ICD-10-CM | POA: Diagnosis not present

## 2023-05-18 DIAGNOSIS — Z419 Encounter for procedure for purposes other than remedying health state, unspecified: Secondary | ICD-10-CM | POA: Diagnosis not present

## 2023-06-18 DIAGNOSIS — Z419 Encounter for procedure for purposes other than remedying health state, unspecified: Secondary | ICD-10-CM | POA: Diagnosis not present

## 2023-07-19 DIAGNOSIS — Z419 Encounter for procedure for purposes other than remedying health state, unspecified: Secondary | ICD-10-CM | POA: Diagnosis not present

## 2023-08-18 DIAGNOSIS — Z419 Encounter for procedure for purposes other than remedying health state, unspecified: Secondary | ICD-10-CM | POA: Diagnosis not present

## 2023-09-09 ENCOUNTER — Ambulatory Visit: Payer: Medicaid Other | Admitting: Family Medicine

## 2023-09-18 DIAGNOSIS — Z419 Encounter for procedure for purposes other than remedying health state, unspecified: Secondary | ICD-10-CM | POA: Diagnosis not present

## 2023-10-18 DIAGNOSIS — Z419 Encounter for procedure for purposes other than remedying health state, unspecified: Secondary | ICD-10-CM | POA: Diagnosis not present

## 2023-11-18 DIAGNOSIS — Z419 Encounter for procedure for purposes other than remedying health state, unspecified: Secondary | ICD-10-CM | POA: Diagnosis not present

## 2023-12-19 DIAGNOSIS — Z419 Encounter for procedure for purposes other than remedying health state, unspecified: Secondary | ICD-10-CM | POA: Diagnosis not present

## 2024-01-16 DIAGNOSIS — Z419 Encounter for procedure for purposes other than remedying health state, unspecified: Secondary | ICD-10-CM | POA: Diagnosis not present

## 2024-02-27 DIAGNOSIS — Z419 Encounter for procedure for purposes other than remedying health state, unspecified: Secondary | ICD-10-CM | POA: Diagnosis not present

## 2024-02-29 DIAGNOSIS — Z1159 Encounter for screening for other viral diseases: Secondary | ICD-10-CM | POA: Diagnosis not present

## 2024-02-29 DIAGNOSIS — Z789 Other specified health status: Secondary | ICD-10-CM | POA: Diagnosis not present

## 2024-02-29 DIAGNOSIS — F331 Major depressive disorder, recurrent, moderate: Secondary | ICD-10-CM | POA: Diagnosis not present

## 2024-03-03 DIAGNOSIS — Z789 Other specified health status: Secondary | ICD-10-CM | POA: Diagnosis not present

## 2024-03-03 DIAGNOSIS — Z1159 Encounter for screening for other viral diseases: Secondary | ICD-10-CM | POA: Diagnosis not present

## 2024-03-03 DIAGNOSIS — F331 Major depressive disorder, recurrent, moderate: Secondary | ICD-10-CM | POA: Diagnosis not present

## 2024-03-14 DIAGNOSIS — Z1159 Encounter for screening for other viral diseases: Secondary | ICD-10-CM | POA: Diagnosis not present

## 2024-03-14 DIAGNOSIS — F331 Major depressive disorder, recurrent, moderate: Secondary | ICD-10-CM | POA: Diagnosis not present

## 2024-03-14 DIAGNOSIS — Z789 Other specified health status: Secondary | ICD-10-CM | POA: Diagnosis not present

## 2024-03-16 DIAGNOSIS — Z1159 Encounter for screening for other viral diseases: Secondary | ICD-10-CM | POA: Diagnosis not present

## 2024-03-16 DIAGNOSIS — Z789 Other specified health status: Secondary | ICD-10-CM | POA: Diagnosis not present

## 2024-03-16 DIAGNOSIS — F331 Major depressive disorder, recurrent, moderate: Secondary | ICD-10-CM | POA: Diagnosis not present

## 2024-03-21 DIAGNOSIS — Z1159 Encounter for screening for other viral diseases: Secondary | ICD-10-CM | POA: Diagnosis not present

## 2024-03-21 DIAGNOSIS — Z789 Other specified health status: Secondary | ICD-10-CM | POA: Diagnosis not present

## 2024-03-21 DIAGNOSIS — F331 Major depressive disorder, recurrent, moderate: Secondary | ICD-10-CM | POA: Diagnosis not present

## 2024-03-28 DIAGNOSIS — Z419 Encounter for procedure for purposes other than remedying health state, unspecified: Secondary | ICD-10-CM | POA: Diagnosis not present

## 2024-03-29 DIAGNOSIS — Z1159 Encounter for screening for other viral diseases: Secondary | ICD-10-CM | POA: Diagnosis not present

## 2024-03-29 DIAGNOSIS — F331 Major depressive disorder, recurrent, moderate: Secondary | ICD-10-CM | POA: Diagnosis not present

## 2024-03-29 DIAGNOSIS — Z789 Other specified health status: Secondary | ICD-10-CM | POA: Diagnosis not present

## 2024-04-16 DIAGNOSIS — Z1159 Encounter for screening for other viral diseases: Secondary | ICD-10-CM | POA: Diagnosis not present

## 2024-04-16 DIAGNOSIS — Z789 Other specified health status: Secondary | ICD-10-CM | POA: Diagnosis not present

## 2024-04-16 DIAGNOSIS — F331 Major depressive disorder, recurrent, moderate: Secondary | ICD-10-CM | POA: Diagnosis not present

## 2024-04-18 DIAGNOSIS — F331 Major depressive disorder, recurrent, moderate: Secondary | ICD-10-CM | POA: Diagnosis not present

## 2024-04-18 DIAGNOSIS — Z789 Other specified health status: Secondary | ICD-10-CM | POA: Diagnosis not present

## 2024-04-18 DIAGNOSIS — Z1159 Encounter for screening for other viral diseases: Secondary | ICD-10-CM | POA: Diagnosis not present

## 2024-04-28 DIAGNOSIS — Z419 Encounter for procedure for purposes other than remedying health state, unspecified: Secondary | ICD-10-CM | POA: Diagnosis not present

## 2024-05-10 DIAGNOSIS — Z789 Other specified health status: Secondary | ICD-10-CM | POA: Diagnosis not present

## 2024-05-10 DIAGNOSIS — F331 Major depressive disorder, recurrent, moderate: Secondary | ICD-10-CM | POA: Diagnosis not present

## 2024-05-10 DIAGNOSIS — Z1159 Encounter for screening for other viral diseases: Secondary | ICD-10-CM | POA: Diagnosis not present

## 2024-05-28 DIAGNOSIS — Z419 Encounter for procedure for purposes other than remedying health state, unspecified: Secondary | ICD-10-CM | POA: Diagnosis not present

## 2024-06-02 DIAGNOSIS — Z789 Other specified health status: Secondary | ICD-10-CM | POA: Diagnosis not present

## 2024-06-02 DIAGNOSIS — F331 Major depressive disorder, recurrent, moderate: Secondary | ICD-10-CM | POA: Diagnosis not present

## 2024-06-09 DIAGNOSIS — L03011 Cellulitis of right finger: Secondary | ICD-10-CM | POA: Diagnosis not present

## 2024-06-09 DIAGNOSIS — R232 Flushing: Secondary | ICD-10-CM | POA: Diagnosis not present

## 2024-06-16 DIAGNOSIS — Z789 Other specified health status: Secondary | ICD-10-CM | POA: Diagnosis not present

## 2024-06-16 DIAGNOSIS — F331 Major depressive disorder, recurrent, moderate: Secondary | ICD-10-CM | POA: Diagnosis not present

## 2024-06-28 DIAGNOSIS — Z419 Encounter for procedure for purposes other than remedying health state, unspecified: Secondary | ICD-10-CM | POA: Diagnosis not present

## 2024-06-30 DIAGNOSIS — F331 Major depressive disorder, recurrent, moderate: Secondary | ICD-10-CM | POA: Diagnosis not present

## 2024-06-30 DIAGNOSIS — Z789 Other specified health status: Secondary | ICD-10-CM | POA: Diagnosis not present

## 2024-07-04 DIAGNOSIS — R1110 Vomiting, unspecified: Secondary | ICD-10-CM | POA: Diagnosis not present

## 2024-07-17 DIAGNOSIS — Z789 Other specified health status: Secondary | ICD-10-CM | POA: Diagnosis not present

## 2024-07-17 DIAGNOSIS — F331 Major depressive disorder, recurrent, moderate: Secondary | ICD-10-CM | POA: Diagnosis not present

## 2024-07-19 DIAGNOSIS — F418 Other specified anxiety disorders: Secondary | ICD-10-CM | POA: Diagnosis not present

## 2024-07-28 DIAGNOSIS — Z789 Other specified health status: Secondary | ICD-10-CM | POA: Diagnosis not present

## 2024-07-28 DIAGNOSIS — F331 Major depressive disorder, recurrent, moderate: Secondary | ICD-10-CM | POA: Diagnosis not present

## 2024-07-29 DIAGNOSIS — Z419 Encounter for procedure for purposes other than remedying health state, unspecified: Secondary | ICD-10-CM | POA: Diagnosis not present

## 2024-08-08 ENCOUNTER — Ambulatory Visit

## 2024-08-08 DIAGNOSIS — R369 Urethral discharge, unspecified: Secondary | ICD-10-CM | POA: Diagnosis not present

## 2024-08-16 DIAGNOSIS — Z789 Other specified health status: Secondary | ICD-10-CM | POA: Diagnosis not present

## 2024-08-16 DIAGNOSIS — F331 Major depressive disorder, recurrent, moderate: Secondary | ICD-10-CM | POA: Diagnosis not present

## 2024-08-25 DIAGNOSIS — Z789 Other specified health status: Secondary | ICD-10-CM | POA: Diagnosis not present

## 2024-08-25 DIAGNOSIS — F331 Major depressive disorder, recurrent, moderate: Secondary | ICD-10-CM | POA: Diagnosis not present

## 2024-09-16 DIAGNOSIS — F331 Major depressive disorder, recurrent, moderate: Secondary | ICD-10-CM | POA: Diagnosis not present

## 2024-09-16 DIAGNOSIS — Z789 Other specified health status: Secondary | ICD-10-CM | POA: Diagnosis not present

## 2024-09-19 DIAGNOSIS — F331 Major depressive disorder, recurrent, moderate: Secondary | ICD-10-CM | POA: Diagnosis not present

## 2024-09-19 DIAGNOSIS — Z789 Other specified health status: Secondary | ICD-10-CM | POA: Diagnosis not present

## 2024-10-14 DIAGNOSIS — Z202 Contact with and (suspected) exposure to infections with a predominantly sexual mode of transmission: Secondary | ICD-10-CM | POA: Diagnosis not present

## 2024-10-14 DIAGNOSIS — A749 Chlamydial infection, unspecified: Secondary | ICD-10-CM | POA: Diagnosis not present

## 2024-10-16 DIAGNOSIS — Z789 Other specified health status: Secondary | ICD-10-CM | POA: Diagnosis not present

## 2024-10-16 DIAGNOSIS — F331 Major depressive disorder, recurrent, moderate: Secondary | ICD-10-CM | POA: Diagnosis not present

## 2024-10-20 DIAGNOSIS — F331 Major depressive disorder, recurrent, moderate: Secondary | ICD-10-CM | POA: Diagnosis not present

## 2024-10-20 DIAGNOSIS — Z789 Other specified health status: Secondary | ICD-10-CM | POA: Diagnosis not present

## 2024-10-26 DIAGNOSIS — B349 Viral infection, unspecified: Secondary | ICD-10-CM | POA: Diagnosis not present

## 2024-10-28 DIAGNOSIS — Z419 Encounter for procedure for purposes other than remedying health state, unspecified: Secondary | ICD-10-CM | POA: Diagnosis not present

## 2024-11-14 DIAGNOSIS — F331 Major depressive disorder, recurrent, moderate: Secondary | ICD-10-CM | POA: Diagnosis not present

## 2024-11-14 DIAGNOSIS — Z789 Other specified health status: Secondary | ICD-10-CM | POA: Diagnosis not present
